# Patient Record
Sex: Male | Born: 1990 | Race: White | Hispanic: No | Marital: Married | Smoking: Former smoker
Health system: Southern US, Community
[De-identification: ages and names within clinical notes are randomized; demographics above are authoritative.]

## PROBLEM LIST (undated history)

## (undated) DIAGNOSIS — G43909 Migraine, unspecified, not intractable, without status migrainosus: Secondary | ICD-10-CM

---
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
## 2019-10-04 ENCOUNTER — Other Ambulatory Visit: Payer: Self-pay

## 2019-10-04 DIAGNOSIS — Z20822 Contact with and (suspected) exposure to covid-19: Secondary | ICD-10-CM

## 2019-10-06 LAB — NOVEL CORONAVIRUS, NAA: SARS-CoV-2, NAA: DETECTED — AB

## 2020-02-19 ENCOUNTER — Emergency Department (HOSPITAL_COMMUNITY): Payer: Self-pay

## 2020-02-19 ENCOUNTER — Other Ambulatory Visit: Payer: Self-pay

## 2020-02-19 ENCOUNTER — Encounter (HOSPITAL_COMMUNITY): Payer: Self-pay | Admitting: *Deleted

## 2020-02-19 ENCOUNTER — Emergency Department (HOSPITAL_COMMUNITY)
Admission: EM | Admit: 2020-02-19 | Discharge: 2020-02-19 | Disposition: A | Discharge status: Home / Self Care | Attending: Emergency Medicine | Admitting: Emergency Medicine

## 2020-02-19 DIAGNOSIS — Y929 Unspecified place or not applicable: Secondary | ICD-10-CM | POA: Diagnosis not present

## 2020-02-19 DIAGNOSIS — Y9389 Activity, other specified: Secondary | ICD-10-CM | POA: Insufficient documentation

## 2020-02-19 DIAGNOSIS — S61012A Laceration without foreign body of left thumb without damage to nail, initial encounter: Secondary | ICD-10-CM | POA: Insufficient documentation

## 2020-02-19 DIAGNOSIS — Z87891 Personal history of nicotine dependence: Secondary | ICD-10-CM | POA: Insufficient documentation

## 2020-02-19 DIAGNOSIS — W270XXA Contact with workbench tool, initial encounter: Secondary | ICD-10-CM | POA: Insufficient documentation

## 2020-02-19 DIAGNOSIS — Y990 Civilian activity done for income or pay: Secondary | ICD-10-CM | POA: Insufficient documentation

## 2020-02-19 DIAGNOSIS — S6992XA Unspecified injury of left wrist, hand and finger(s), initial encounter: Secondary | ICD-10-CM | POA: Diagnosis present

## 2020-02-19 HISTORY — DX: Migraine, unspecified, not intractable, without status migrainosus: G43.909

## 2020-02-19 MED ORDER — LIDOCAINE HCL (PF) 1 % IJ SOLN
5.0000 mL | Freq: Once | INTRAMUSCULAR | Status: AC
  Administered 2020-02-19: 10:00:00 5 mL
  Filled 2020-02-19: qty 30

## 2020-02-19 NOTE — ED Provider Notes (Signed)
[facility] EMERGENCY DEPARTMENT Provider Note   CSN: [id number] Arrival date & time: 02/19/20  6644     History Chief Complaint  Patient presents with  . Finger Injury    Dalton Aguilar is a 29 y.o. male.  HPI   Patient presents to the ED for evaluation of a thumb injury.  Patient was at work when he accidentally injured his left thumb with a table saw.  Patient states he lacerated the tip of his finger.  He went to occupational health and had a bandage applied.  He denies any other injuries.  Tetanus shot was 3 years ago  Past Medical History:  Diagnosis Date  . Migraine     There are no problems to display for this patient.   History reviewed. No pertinent surgical history.     History reviewed. No pertinent family history.  Social History   Tobacco Use  . Smoking status: Former Smoker  Substance Use Topics  . Alcohol use: Yes    Comment: socially  . Drug use: Never    Home Medications Prior to Admission medications   Not on File    Allergies    Patient has no known allergies.  Review of Systems   Review of Systems  All other systems reviewed and are negative.   Physical Exam Updated Vital Signs BP (!) 155/84 (BP Location: Right Arm)   Temp 98.4 F (36.9 C) (Oral)   Ht 1.829 m (6')   Wt 88.5 kg   SpO2 99%   BMI 26.45 kg/m   Physical Exam Vitals and nursing note reviewed.  Constitutional:      General: He is not in acute distress.    Appearance: He is well-developed.  HENT:     Head: Normocephalic and atraumatic.     Right Ear: External ear normal.     Left Ear: External ear normal.  Eyes:     General: No scleral icterus.       Right eye: No discharge.        Left eye: No discharge.     Conjunctiva/sclera: Conjunctivae normal.  Neck:     Trachea: No tracheal deviation.  Cardiovascular:     Rate and Rhythm: Normal rate.  Pulmonary:     Effort: Pulmonary effort is normal. No respiratory distress.     Breath  sounds: No stridor.  Abdominal:     General: There is no distension.  Musculoskeletal:        General: Signs of injury present. No swelling or deformity.     Cervical back: Neck supple.     Comments: Laceration right at the tip of the left finger, very superficial injury to the distal aspect of the nail, base of the wound just peers to involve the tip of the finger pad, mild venous bleeding that stops with pressure  Skin:    General: Skin is warm and dry.     Findings: No rash.  Neurological:     Mental Status: He is alert.     Cranial Nerves: Cranial nerve deficit: no gross deficits.     ED Results / Procedures / Treatments   Labs (all labs ordered are listed, but only abnormal results are displayed) Labs Reviewed - No data to display  EKG None  Radiology DG Finger Thumb Left  Result Date: 02/19/2020 CLINICAL DATA:  Table saw injury, laceration EXAM: LEFT THUMB 2+V COMPARISON:  None. FINDINGS: There is no evidence of fracture or dislocation. There is  no evidence of arthropathy or other focal bone abnormality. Laceration of the distal thumb with bandage material applied. IMPRESSION: No fracture or dislocation of the left thumb. No radiopaque foreign body identified. Electronically Signed   By: Lauralyn Primes M.D.   On: 02/19/2020 09:53    Procedures .Marland KitchenLaceration Repair  Date/Time: 02/19/2020 10:27 AM Performed by: Linwood Dibbles, MD Authorized by: Linwood Dibbles, MD   Consent:    Consent obtained:  Verbal   Consent given by:  Patient   Risks discussed:  Infection, need for additional repair, pain, poor cosmetic result and poor wound healing   Alternatives discussed:  No treatment and delayed treatment Universal protocol:    Procedure explained and questions answered to patient or proxy's satisfaction: yes     Relevant documents present and verified: yes     Test results available and properly labeled: yes     Imaging studies available: yes     Required blood products, implants,  devices, and special equipment available: yes     Site/side marked: yes     Immediately prior to procedure, a time out was called: yes     Patient identity confirmed:  Verbally with patient Anesthesia (see MAR for exact dosages):    Anesthesia method:  Local infiltration   Local anesthetic:  Lidocaine 1% w/o epi Laceration details:    Location: Tip of left thumb.   Length (cm):  1 Repair type:    Repair type:  Simple Pre-procedure details:    Preparation:  Patient was prepped and draped in usual sterile fashion Exploration:    Hemostasis achieved with:  Direct pressure   Wound exploration: entire depth of wound probed and visualized     Wound extent: no nerve damage noted, no tendon damage noted, no underlying fracture noted and no vascular damage noted     Contaminated: no   Treatment:    Area cleansed with:  Saline   Amount of cleaning:  Standard Skin repair:    Repair method:  Sutures   Suture size:  5-0   Wound skin closure material used: Vicryl.   Suture technique:  Simple interrupted   Number of sutures:  1 Approximation:    Approximation:  Loose Post-procedure details:    Dressing:  Antibiotic ointment and bulky dressing   Patient tolerance of procedure:  Tolerated well, no immediate complications Comments:     Macerated tissue at the distal aspect of the thumb, flap type laceration,  Flap approximately 1 cm by 3 mm, 1 suture used to tack down the flap   (including critical care time)  Medications Ordered in ED Medications  lidocaine (PF) (XYLOCAINE) 1 % injection 5 mL (5 mLs Infiltration Given 02/19/20 1005)    ED Course  I have reviewed the triage vital signs and the nursing notes.  Pertinent labs & imaging results that were available during my care of the patient were reviewed by me and considered in my medical decision making (see chart for details).    MDM Rules/Calculators/A&P                      Patient with a superficial laceration of the distal  aspect of the thumb, no evidence of fracture, neurovascularly intact, tissue was macerated from the table salt although her there was a flap that I tacked down with 1 suture to help promote healing.  Discussed with the patient that this tissue flap may not be viable but it is a rather superficial laceration  that should heal well regardless. Final Clinical Impression(s) / ED Diagnoses Final diagnoses:  Laceration of left thumb without foreign body, nail damage status unspecified, initial encounter    Rx / DC Orders ED Discharge Orders    None       Dorie Rank, MD 02/19/20 1030

## 2020-02-19 NOTE — ED Notes (Signed)
Pt discharge instructions and follow-up care reviewed with the patient. The patient verbalized understanding of both. Pt discharged. 

## 2020-02-19 NOTE — ED Notes (Signed)
Patient transported to X-ray 

## 2020-02-19 NOTE — Discharge Instructions (Addendum)
Keep the laceration covered with a clean dressing.  Replace it daily.  Monitor for signs of infection.  The wound should heal over the next couple of weeks.  The suture that was placed is absorbable and you do not have to have it removed.

## 2020-02-19 NOTE — ED Triage Notes (Signed)
Pt reports injuring left thumb with table saw this am. Has laceration to thumb and part of thumb pad removed. Bleeding controlled and bandage applied pta by occupational health. No acute distress is noted at triage.

## 2020-07-11 DIAGNOSIS — F5232 Male orgasmic disorder: Secondary | ICD-10-CM | POA: Diagnosis not present

## 2020-07-11 DIAGNOSIS — R5383 Other fatigue: Secondary | ICD-10-CM | POA: Diagnosis not present

## 2020-07-11 DIAGNOSIS — N529 Male erectile dysfunction, unspecified: Secondary | ICD-10-CM | POA: Diagnosis not present

## 2020-07-12 DIAGNOSIS — F5232 Male orgasmic disorder: Secondary | ICD-10-CM | POA: Diagnosis not present

## 2020-07-12 DIAGNOSIS — N529 Male erectile dysfunction, unspecified: Secondary | ICD-10-CM | POA: Diagnosis not present

## 2020-07-12 DIAGNOSIS — R5383 Other fatigue: Secondary | ICD-10-CM | POA: Diagnosis not present

## 2020-08-07 DIAGNOSIS — R0981 Nasal congestion: Secondary | ICD-10-CM | POA: Diagnosis not present

## 2020-08-07 DIAGNOSIS — J3489 Other specified disorders of nose and nasal sinuses: Secondary | ICD-10-CM | POA: Diagnosis not present

## 2020-08-07 DIAGNOSIS — J342 Deviated nasal septum: Secondary | ICD-10-CM | POA: Diagnosis not present

## 2020-08-07 DIAGNOSIS — R0683 Snoring: Secondary | ICD-10-CM | POA: Diagnosis not present

## 2020-08-12 DIAGNOSIS — G4719 Other hypersomnia: Secondary | ICD-10-CM | POA: Diagnosis not present

## 2020-09-04 DIAGNOSIS — G4733 Obstructive sleep apnea (adult) (pediatric): Secondary | ICD-10-CM | POA: Diagnosis not present

## 2020-09-04 IMAGING — CR DG FINGER THUMB 2+V*L*
3 series · 3 of 3 positions shown · non-contrast
Comparison: None.

CLINICAL DATA: Table saw injury, laceration

EXAM:
LEFT THUMB 2+V

[finger ap]
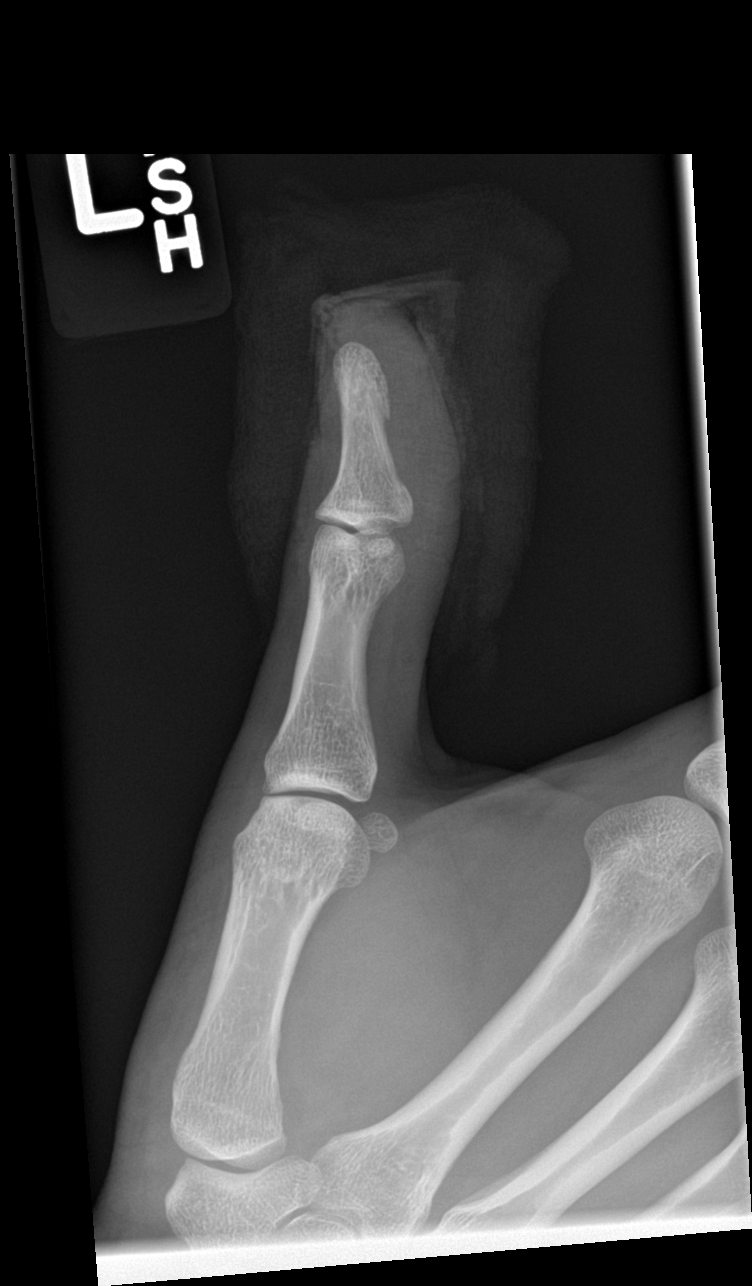

[finger obl]
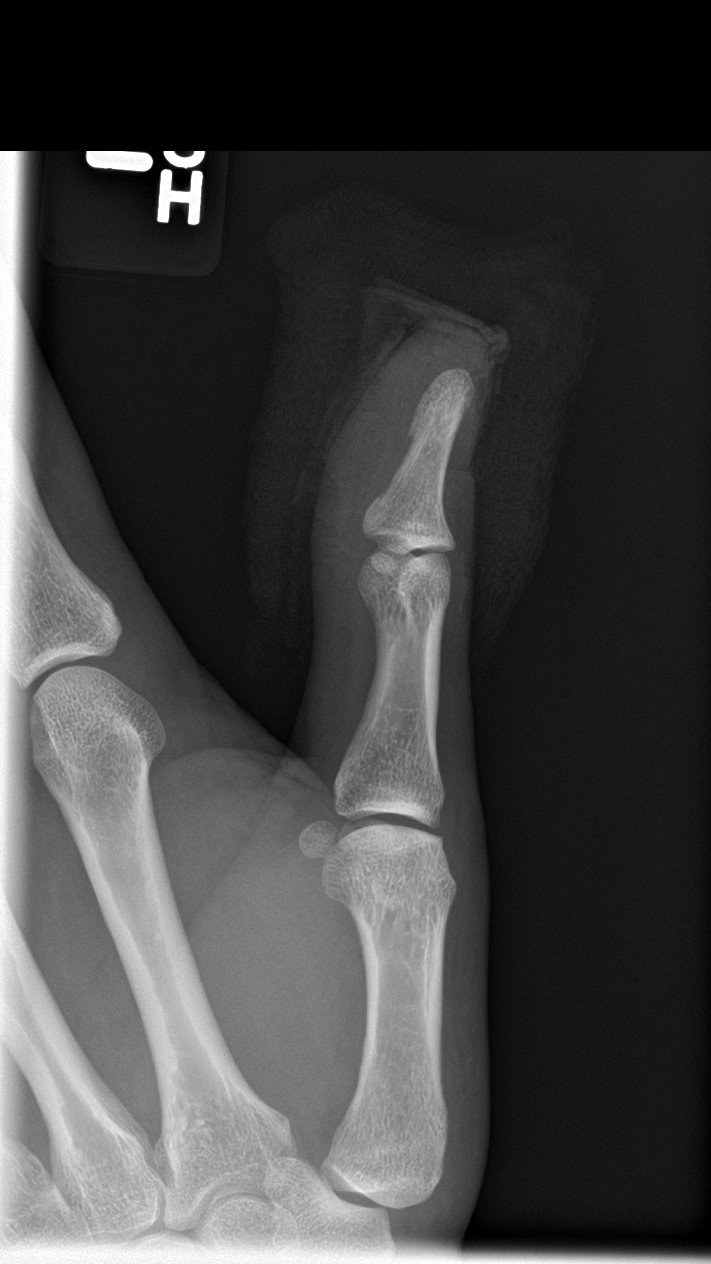

[finger lat]
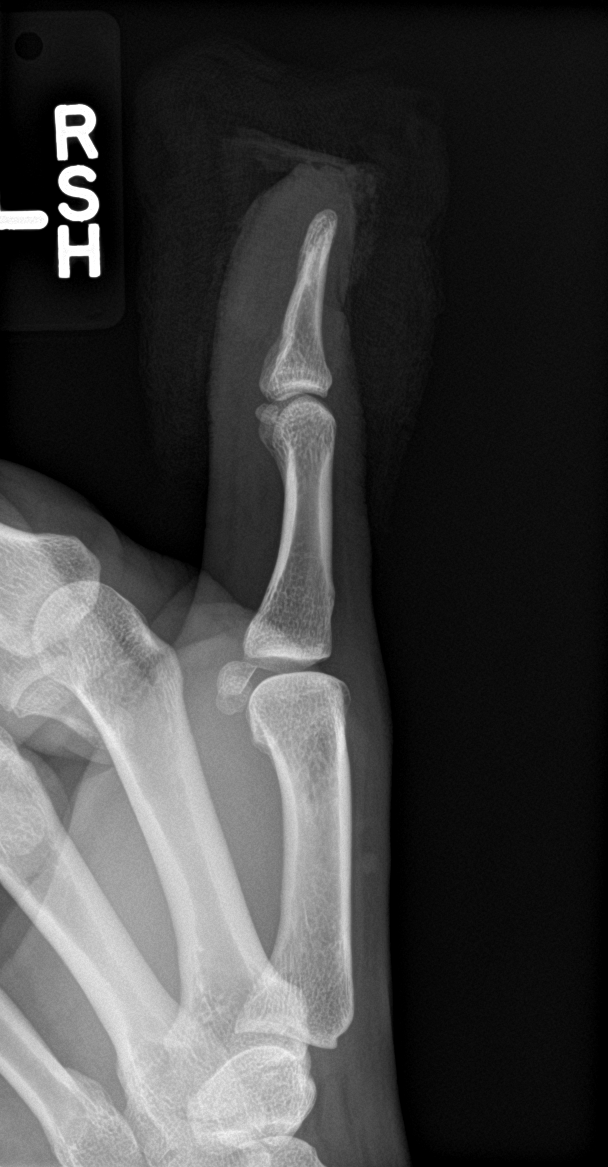

[3 of 3 positions shown; findings below may reference images not displayed]

FINDINGS: There is no evidence of fracture or dislocation. There is no
evidence of arthropathy or other focal bone abnormality. Laceration
of the distal thumb with bandage material applied.
IMPRESSION: No fracture or dislocation of the left thumb. No radiopaque foreign
body identified.

## 2020-09-11 DIAGNOSIS — G4733 Obstructive sleep apnea (adult) (pediatric): Secondary | ICD-10-CM | POA: Diagnosis not present

## 2020-09-24 DIAGNOSIS — J342 Deviated nasal septum: Secondary | ICD-10-CM | POA: Diagnosis not present

## 2020-09-24 DIAGNOSIS — J3489 Other specified disorders of nose and nasal sinuses: Secondary | ICD-10-CM | POA: Diagnosis not present

## 2020-09-24 DIAGNOSIS — M950 Acquired deformity of nose: Secondary | ICD-10-CM | POA: Diagnosis not present

## 2020-09-24 DIAGNOSIS — J343 Hypertrophy of nasal turbinates: Secondary | ICD-10-CM | POA: Diagnosis not present

## 2020-10-12 DIAGNOSIS — G4733 Obstructive sleep apnea (adult) (pediatric): Secondary | ICD-10-CM | POA: Diagnosis not present

## 2020-11-11 DIAGNOSIS — G4733 Obstructive sleep apnea (adult) (pediatric): Secondary | ICD-10-CM | POA: Diagnosis not present

## 2020-12-10 DIAGNOSIS — G4733 Obstructive sleep apnea (adult) (pediatric): Secondary | ICD-10-CM | POA: Diagnosis not present

## 2020-12-12 DIAGNOSIS — G4733 Obstructive sleep apnea (adult) (pediatric): Secondary | ICD-10-CM | POA: Diagnosis not present

## 2021-01-30 DIAGNOSIS — N529 Male erectile dysfunction, unspecified: Secondary | ICD-10-CM | POA: Diagnosis not present

## 2021-01-30 DIAGNOSIS — G43109 Migraine with aura, not intractable, without status migrainosus: Secondary | ICD-10-CM | POA: Diagnosis not present

## 2021-01-30 DIAGNOSIS — F900 Attention-deficit hyperactivity disorder, predominantly inattentive type: Secondary | ICD-10-CM | POA: Diagnosis not present

## 2021-03-12 DIAGNOSIS — S80861A Insect bite (nonvenomous), right lower leg, initial encounter: Secondary | ICD-10-CM | POA: Diagnosis not present

## 2021-03-12 DIAGNOSIS — L03115 Cellulitis of right lower limb: Secondary | ICD-10-CM | POA: Diagnosis not present

## 2021-03-12 DIAGNOSIS — W57XXXA Bitten or stung by nonvenomous insect and other nonvenomous arthropods, initial encounter: Secondary | ICD-10-CM | POA: Diagnosis not present

## 2021-03-17 DIAGNOSIS — M950 Acquired deformity of nose: Secondary | ICD-10-CM | POA: Diagnosis not present

## 2021-03-17 DIAGNOSIS — J343 Hypertrophy of nasal turbinates: Secondary | ICD-10-CM | POA: Diagnosis not present

## 2021-03-17 DIAGNOSIS — J3489 Other specified disorders of nose and nasal sinuses: Secondary | ICD-10-CM | POA: Diagnosis not present

## 2021-03-17 DIAGNOSIS — J342 Deviated nasal septum: Secondary | ICD-10-CM | POA: Diagnosis not present

## 2021-07-03 DIAGNOSIS — J3489 Other specified disorders of nose and nasal sinuses: Secondary | ICD-10-CM | POA: Diagnosis not present

## 2021-07-03 DIAGNOSIS — J342 Deviated nasal septum: Secondary | ICD-10-CM | POA: Diagnosis not present

## 2021-07-03 DIAGNOSIS — M950 Acquired deformity of nose: Secondary | ICD-10-CM | POA: Diagnosis not present

## 2021-07-21 DIAGNOSIS — R5383 Other fatigue: Secondary | ICD-10-CM | POA: Diagnosis not present

## 2021-07-21 DIAGNOSIS — F900 Attention-deficit hyperactivity disorder, predominantly inattentive type: Secondary | ICD-10-CM | POA: Diagnosis not present
